# Patient Record
Sex: Male | Born: 1960 | Race: Black or African American | Hispanic: No | Marital: Single | Smoking: Former smoker
Health system: Southern US, Community
[De-identification: ages and names within clinical notes are randomized; demographics above are authoritative.]

## PROBLEM LIST (undated history)

## (undated) DIAGNOSIS — N529 Male erectile dysfunction, unspecified: Secondary | ICD-10-CM

## (undated) DIAGNOSIS — T7840XA Allergy, unspecified, initial encounter: Secondary | ICD-10-CM

## (undated) HISTORY — PX: BACK SURGERY: SHX140

## (undated) HISTORY — DX: Male erectile dysfunction, unspecified: N52.9

## (undated) HISTORY — PX: ELBOW ARTHROSCOPY: SUR87

---

## 2009-08-18 HISTORY — PX: MICRODISCECTOMY LUMBAR: SUR864

## 2009-09-14 ENCOUNTER — Ambulatory Visit: Payer: Self-pay | Admitting: Anesthesiology

## 2009-12-01 ENCOUNTER — Ambulatory Visit: Payer: Self-pay | Admitting: Anesthesiology

## 2009-12-24 ENCOUNTER — Ambulatory Visit: Payer: Self-pay | Admitting: Anesthesiology

## 2010-02-25 ENCOUNTER — Ambulatory Visit: Payer: Self-pay | Admitting: Anesthesiology

## 2013-08-04 ENCOUNTER — Ambulatory Visit: Payer: Self-pay | Admitting: Physician Assistant

## 2013-12-09 ENCOUNTER — Ambulatory Visit: Payer: Self-pay | Admitting: Physician Assistant

## 2014-01-03 ENCOUNTER — Emergency Department: Payer: Self-pay | Admitting: Emergency Medicine
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
## 2015-07-04 ENCOUNTER — Ambulatory Visit
Admission: EM | Admit: 2015-07-04 | Discharge: 2015-07-04 | Disposition: A | Discharge status: Home / Self Care | Payer: Worker's Compensation | Attending: Family Medicine | Admitting: Family Medicine

## 2015-07-04 DIAGNOSIS — S61512A Laceration without foreign body of left wrist, initial encounter: Secondary | ICD-10-CM | POA: Diagnosis not present

## 2015-07-04 MED ORDER — TETANUS-DIPHTH-ACELL PERTUSSIS 5-2.5-18.5 LF-MCG/0.5 IM SUSP
0.5000 mL | Freq: Once | INTRAMUSCULAR | Status: AC
  Administered 2015-07-04: 0.5 mL via INTRAMUSCULAR

## 2015-07-04 MED ORDER — AMOXICILLIN-POT CLAVULANATE 875-125 MG PO TABS: 1.0000 | ORAL_TABLET | Freq: Two times a day (BID) | ORAL | Status: DC

## 2015-07-04 NOTE — ED Notes (Signed)
States at work and got a 1 cm laceration on top left wrist. Bleeding controlled.

## 2015-07-13 ENCOUNTER — Ambulatory Visit
Admission: EM | Admit: 2015-07-13 | Discharge: 2015-07-13 | Disposition: A | Discharge status: Home / Self Care | Payer: Worker's Compensation

## 2015-07-13 NOTE — ED Notes (Signed)
3 sutures placed 9 days ago left outer wrist. For suture removal. Sutures removed easily and steri strips applied.

## 2015-08-17 ENCOUNTER — Ambulatory Visit
Admission: EM | Admit: 2015-08-17 | Discharge: 2015-08-17 | Disposition: A | Discharge status: Home / Self Care | Payer: Managed Care, Other (non HMO) | Attending: Family Medicine | Admitting: Family Medicine

## 2015-08-17 ENCOUNTER — Encounter: Payer: Self-pay | Admitting: Emergency Medicine

## 2015-08-17 DIAGNOSIS — J111 Influenza due to unidentified influenza virus with other respiratory manifestations: Secondary | ICD-10-CM

## 2015-08-17 LAB — RAPID INFLUENZA A&B ANTIGENS
Influenza A (ARMC): DETECTED — AB
Influenza B (ARMC): NOT DETECTED — AB

## 2015-08-17 MED ORDER — FEXOFENADINE-PSEUDOEPHED ER 180-240 MG PO TB24: 1.0000 | ORAL_TABLET | Freq: Every day | ORAL | Status: DC

## 2015-08-17 MED ORDER — HYDROCOD POLST-CPM POLST ER 10-8 MG/5ML PO SUER: 5.0000 mL | Freq: Two times a day (BID) | ORAL | Status: DC | PRN

## 2015-08-17 MED ORDER — MELOXICAM 15 MG PO TABS: 15.0000 mg | ORAL_TABLET | Freq: Every day | ORAL | Status: DC

## 2015-08-17 MED ORDER — OSELTAMIVIR PHOSPHATE 75 MG PO CAPS: 75.0000 mg | ORAL_CAPSULE | Freq: Two times a day (BID) | ORAL | Status: DC

## 2015-08-17 NOTE — ED Provider Notes (Signed)
CSN: [id number]     Arrival date & time 08/17/15  1330 History   First MD Initiated Contact with Patient 08/17/15 1604    Nurses notes were reviewed. Chief Complaint  Patient presents with  . Cough  . Nasal Congestion  . Generalized Body Aches   Patient reports doing fine until Saturday recently became sick. Several coworkers with the flu. He did get flu shot in 2016. He stopped smoking a few years ago. Mother had cancer and a grandmother with diabetes. No known drug allergies. States she's also been running a fever as well as the other aches and pains sore throat runny nose and nasal congestion and myalgia    (Consider location/radiation/quality/duration/timing/severity/associated sxs/prior Treatment) Patient is a 55 y.o. male presenting with cough. The history is provided by the patient.  Cough Cough characteristics:  Productive and hacking Sputum characteristics:  Wallace Cullens Severity:  Moderate Progression:  Worsening Chronicity:  New Context: upper respiratory infection   Relieved by:  Nothing Ineffective treatments:  Cough suppressants Associated symptoms: chills, fever, myalgias, rhinorrhea, sinus congestion and sore throat     History reviewed. No pertinent past medical history. Past Surgical History  Procedure Laterality Date  . Back surgery     Family History  Problem Relation Age of Onset  . Cancer Mother    Social History  Substance Use Topics  . Smoking status: Former Games developer  . Smokeless tobacco: None  . Alcohol Use: No    Review of Systems  Constitutional: Positive for fever and chills.  HENT: Positive for rhinorrhea and sore throat.   Respiratory: Positive for cough.   Musculoskeletal: Positive for myalgias.  All other systems reviewed and are negative.   Allergies  Review of patient's allergies indicates no known allergies.  Home Medications   Prior to Admission medications   Medication Sig Start Date End Date Taking? Authorizing Provider   amoxicillin-clavulanate (AUGMENTIN) 875-125 MG tablet Take 1 tablet by mouth 2 (two) times daily. 07/04/15   Payton Mccallum, MD  cetirizine (ZYRTEC) 10 MG tablet Take 10 mg by mouth daily.    Historical Provider, MD  chlorpheniramine-HYDROcodone (TUSSIONEX PENNKINETIC ER) 10-8 MG/5ML SUER Take 5 mLs by mouth every 12 (twelve) hours as needed for cough. 08/17/15   Hassan Rowan, MD  fexofenadine-pseudoephedrine (ALLEGRA-D ALLERGY & CONGESTION) 180-240 MG 24 hr tablet Take 1 tablet by mouth daily. 08/17/15   Hassan Rowan, MD  meloxicam (MOBIC) 15 MG tablet Take 1 tablet (15 mg total) by mouth daily. 08/17/15   Hassan Rowan, MD  oseltamivir (TAMIFLU) 75 MG capsule Take 1 capsule (75 mg total) by mouth 2 (two) times daily. 08/17/15   Hassan Rowan, MD  Triamcinolone Acetonide (NASACORT ALLERGY 24HR NA) Place into the nose.    Historical Provider, MD   Meds Ordered and Administered this Visit  Medications - No data to display  BP 123/75 mmHg  Pulse 84  Temp(Src) 97.5 F (36.4 C) (Tympanic)  Resp 16  Ht  (1.854 m)  Wt 210 lb (95.255 kg)  BMI 27.71 kg/m2  SpO2 100% No data found.   Physical Exam  Constitutional: He is oriented to person, place, and time. He appears well-developed and well-nourished.  HENT:  Head: Normocephalic and atraumatic.  Right Ear: Hearing, tympanic membrane, external ear and ear canal normal.  Left Ear: Hearing, tympanic membrane, external ear and ear canal normal.  Nose: Mucosal edema present.  Mouth/Throat: Uvula is midline. Posterior oropharyngeal erythema present.  Eyes: Conjunctivae are normal. Pupils are equal, round,  and reactive to light.  Neck: Normal range of motion. Neck supple.  Cardiovascular: Normal rate, regular rhythm and normal heart sounds.   Pulmonary/Chest: Effort normal and breath sounds normal.  Musculoskeletal: Normal range of motion. He exhibits no tenderness.  Lymphadenopathy:    He has cervical adenopathy.  Neurological: He is alert and  oriented to person, place, and time.  Skin: Skin is warm and dry.  Psychiatric: He has a normal mood and affect.  Vitals reviewed.   ED Course  Procedures (including critical care time)  Labs Review Labs Reviewed  RAPID INFLUENZA A&B ANTIGENS ([facility] ONLY) - Abnormal; Notable for the following:    Influenza A ([facility]) DETECTED (*)    Influenza B ([facility]) NOT DETECTED (*)    All other components within normal limits    Imaging Review No results found.   Visual Acuity Review  Right Eye Distance:   Left Eye Distance:   Bilateral Distance:    Right Eye Near:   Left Eye Near:    Bilateral Near:      Results for orders placed or performed during the hospital encounter of 08/17/15  Rapid Influenza A&B Antigens ([facility] only)  Result Value Ref Range   Influenza A ([facility]) DETECTED (A) NEGATIVE   Influenza B ([facility]) NOT DETECTED (A) NEGATIVE     MDM   1. Flu    We'll treat with Tamiflu twice a day 5 days, Tussionex 1 teaspoon twice a day, Allegra-D 1 tablet daily instead of Zyrtec. Mobic 15 mg of aches and pain 1 tablet daily. Work note given for Monday to see him Wednesday he may return to work on Thursday and is not better Thursday please see his PCP.  Note: This dictation was prepared with Dragon dictation along with smaller phrase technology. Any transcriptional errors that result from this process are unintentional.    Hassan Rowan, MD 08/17/15 [phone]

## 2015-08-17 NOTE — ED Notes (Signed)
Patient c/o cough, stuffy nose, HAs, and bodyaches since Saturday.  Patient report fever.

## 2015-08-17 NOTE — Discharge Instructions (Signed)
Influenza, Adult °Influenza (flu) is an infection in the mouth, nose, and throat (respiratory tract) caused by a virus. The flu can make you feel very ill. Influenza spreads easily from person to person (contagious).  °HOME CARE  °· Only take medicines as told by your doctor. °· Use a cool mist humidifier to make breathing easier. °· Get plenty of rest until your fever goes away. This usually takes 3 to 4 days. °· Drink enough fluids to keep your pee (urine) clear or pale yellow. °· Cover your mouth and nose when you cough or sneeze. °· Wash your hands well to avoid spreading the flu. °· Stay home from work or school until your fever has been gone for at least 1 full day. °· Get a flu shot every year. °GET HELP RIGHT AWAY IF:  °· You have trouble breathing or feel short of breath. °· Your skin or nails turn blue. °· You have severe neck pain or stiffness. °· You have a severe headache, facial pain, or earache. °· Your fever gets worse or keeps coming back. °· You feel sick to your stomach (nauseous), throw up (vomit), or have watery poop (diarrhea). °· You have chest pain. °· You have a deep cough that gets worse, or you cough up more thick spit (mucus). °MAKE SURE YOU:  °· Understand these instructions. °· Will watch your condition. °· Will get help right away if you are not doing well or get worse. °  °This information is not intended to replace advice given to you by your health care provider. Make sure you discuss any questions you have with your health care provider. °  °Document Released: 03/15/2008 Document Revised: 06/27/2014 Document Reviewed: 09/05/2011 °Elsevier Interactive Patient Education ©2016 [employer] ° °Upper Respiratory Infection, Adult °Most upper respiratory infections (URIs) are caused by a virus. A URI affects the nose, throat, and upper air passages. The most common type of URI is often called "the common cold." °HOME CARE  °· Take medicines only as told by your doctor. °· Gargle warm  saltwater or take cough drops to comfort your throat as told by your doctor. °· Use a warm mist humidifier or inhale steam from a shower to increase air moisture. This may make it easier to breathe. °· Drink enough fluid to keep your pee (urine) clear or pale yellow. °· Eat soups and other clear broths. °· Have a healthy diet. °· Rest as needed. °· Go back to work when your fever is gone or your doctor says it is okay. °¨ You may need to stay home longer to avoid giving your URI to others. °¨ You can also wear a face mask and wash your hands often to prevent spread of the virus. °· Use your inhaler more if you have asthma. °· Do not use any tobacco products, including cigarettes, chewing tobacco, or electronic cigarettes. If you need help quitting, ask your doctor. °GET HELP IF: °· You are getting worse, not better. °· Your symptoms are not helped by medicine. °· You have chills. °· You are getting more short of breath. °· You have brown or red mucus. °· You have yellow or brown discharge from your nose. °· You have pain in your face, especially when you bend forward. °· You have a fever. °· You have puffy (swollen) neck glands. °· You have pain while swallowing. °· You have white areas in the back of your throat. °GET HELP RIGHT AWAY IF:  °· You have very bad   or constant: °¨ Headache. °¨ Ear pain. °¨ Pain in your forehead, behind your eyes, and over your cheekbones (sinus pain). °¨ Chest pain. °· You have long-lasting (chronic) lung disease and any of the following: °¨ Wheezing. °¨ Long-lasting cough. °¨ Coughing up blood. °¨ A change in your usual mucus. °· You have a stiff neck. °· You have changes in your: °¨ Vision. °¨ Hearing. °¨ Thinking. °¨ Mood. °MAKE SURE YOU:  °· Understand these instructions. °· Will watch your condition. °· Will get help right away if you are not doing well or get worse. °  °This information is not intended to replace advice given to you by your health care provider. Make sure you  discuss any questions you have with your health care provider. °  °Document Released: 11/23/2007 Document Revised: 10/21/2014 Document Reviewed: 09/11/2013 °Elsevier Interactive Patient Education ©2016 Elsevier Inc. ° °

## 2015-08-28 NOTE — ED Provider Notes (Signed)
CSN: [id number]     Arrival date & time 07/04/15  1451 History   First MD Initiated Contact with Patient 07/04/15 1711     Chief Complaint  Patient presents with  . Laceration   (Consider location/radiation/quality/duration/timing/severity/associated sxs/prior Treatment) HPI Comments: 55 yo male with small laceration to top of left wrist about 1cm. Bleeding controlled. Not sure about last tetanus.   Patient is a 55 y.o. male presenting with skin laceration. The history is provided by the patient.  Laceration   History reviewed. No pertinent past medical history. Past Surgical History  Procedure Laterality Date  . Back surgery     Family History  Problem Relation Age of Onset  . Cancer Mother    Social History  Substance Use Topics  . Smoking status: Former Games developer  . Smokeless tobacco: None  . Alcohol Use: No    Review of Systems  Allergies  Review of patient's allergies indicates no known allergies.  Home Medications   Prior to Admission medications   Medication Sig Start Date End Date Taking? Authorizing Provider  cetirizine (ZYRTEC) 10 MG tablet Take 10 mg by mouth daily.   Yes Historical Provider, MD  Triamcinolone Acetonide (NASACORT ALLERGY 24HR NA) Place into the nose.   Yes Historical Provider, MD  amoxicillin-clavulanate (AUGMENTIN) 875-125 MG tablet Take 1 tablet by mouth 2 (two) times daily. 07/04/15   Payton Mccallum, MD  chlorpheniramine-HYDROcodone (TUSSIONEX PENNKINETIC ER) 10-8 MG/5ML SUER Take 5 mLs by mouth every 12 (twelve) hours as needed for cough. 08/17/15   Hassan Rowan, MD  fexofenadine-pseudoephedrine (ALLEGRA-D ALLERGY & CONGESTION) 180-240 MG 24 hr tablet Take 1 tablet by mouth daily. 08/17/15   Hassan Rowan, MD  meloxicam (MOBIC) 15 MG tablet Take 1 tablet (15 mg total) by mouth daily. 08/17/15   Hassan Rowan, MD  oseltamivir (TAMIFLU) 75 MG capsule Take 1 capsule (75 mg total) by mouth 2 (two) times daily. 08/17/15   Hassan Rowan, MD   Meds Ordered  and Administered this Visit   Medications  Tdap (BOOSTRIX) injection 0.5 mL (0.5 mLs Intramuscular Given 07/04/15 1700)    BP 108/66 mmHg  Pulse 80  Temp(Src) 97.1 F (36.2 C) (Tympanic)  Resp 18  Ht  (1.854 m)  Wt 210 lb (95.255 kg)  BMI 27.71 kg/m2  SpO2 98% No data found.   Physical Exam  Constitutional: He appears well-developed and well-nourished. No distress.  Musculoskeletal:       Left wrist: He exhibits laceration (1cm laceration to dorsum of left wrist). He exhibits normal range of motion, no tenderness, no bony tenderness, no swelling, no effusion, no crepitus and no deformity.       Arms: Hand neurovascularly intact  Skin: He is not diaphoretic.  Nursing note and vitals reviewed.   ED Course  Procedures (including critical care time)  Labs Review Labs Reviewed - No data to display  Imaging Review No results found.   Visual Acuity Review  Right Eye Distance:   Left Eye Distance:   Bilateral Distance:    Right Eye Near:   Left Eye Near:    Bilateral Near:         MDM   1. Laceration of wrist, left, initial encounter      Discharge Medication List as of 07/04/2015  5:56 PM    START taking these medications   Details  amoxicillin-clavulanate (AUGMENTIN) 875-125 MG tablet Take 1 tablet by mouth 2 (two) times daily., Starting 07/04/2015, Until Discontinued, Normal  1. diagnosis reviewed with patient; wound area cleaned, prepped in sterile fashion and anesthetized with 1% lidocaine and 3 interrupted sutures placed with 5.0 nylon; patient tolerated well 2. rx as per orders above; reviewed possible side effects, interactions, risks and benefits  3. Patient given Tdap vaccine in clinic; wound care instructions given 4. Follow-up in 1 week for suture removal or sooner prn if any problems; may return to work   Payton Mccallumrlando Leighanne Adolph, MD 08/28/15 1733

## 2016-07-22 ENCOUNTER — Encounter: Payer: Self-pay | Admitting: *Deleted

## 2016-07-25 ENCOUNTER — Encounter: Payer: Self-pay | Admitting: *Deleted

## 2016-07-25 ENCOUNTER — Ambulatory Visit: Payer: Commercial Managed Care - PPO | Admitting: Anesthesiology

## 2016-07-25 ENCOUNTER — Encounter
Admission: RE | Disposition: A | Discharge status: Home / Self Care | Payer: Self-pay | Source: Ambulatory Visit | Attending: Gastroenterology

## 2016-07-25 ENCOUNTER — Ambulatory Visit
Admission: RE | Admit: 2016-07-25 | Discharge: 2016-07-25 | Disposition: A | Discharge status: Home / Self Care | Payer: Commercial Managed Care - PPO | Source: Ambulatory Visit | Attending: Gastroenterology | Admitting: Gastroenterology

## 2016-07-25 DIAGNOSIS — Z1211 Encounter for screening for malignant neoplasm of colon: Secondary | ICD-10-CM | POA: Insufficient documentation

## 2016-07-25 DIAGNOSIS — K5730 Diverticulosis of large intestine without perforation or abscess without bleeding: Secondary | ICD-10-CM | POA: Diagnosis not present

## 2016-07-25 DIAGNOSIS — Z79899 Other long term (current) drug therapy: Secondary | ICD-10-CM | POA: Insufficient documentation

## 2016-07-25 HISTORY — DX: Allergy, unspecified, initial encounter: T78.40XA

## 2016-07-25 HISTORY — PX: COLONOSCOPY WITH PROPOFOL: SHX5780

## 2016-07-25 SURGERY — COLONOSCOPY WITH PROPOFOL
Anesthesia: General

## 2016-07-25 MED ORDER — PROPOFOL 10 MG/ML IV BOLUS
INTRAVENOUS | Status: DC | PRN
  Administered 2016-07-25: 20 mg via INTRAVENOUS
  Administered 2016-07-25: 50 mg via INTRAVENOUS

## 2016-07-25 MED ORDER — FENTANYL CITRATE (PF) 100 MCG/2ML IJ SOLN
INTRAMUSCULAR | Status: AC
  Filled 2016-07-25: qty 2

## 2016-07-25 MED ORDER — SODIUM CHLORIDE 0.9 % IV SOLN
INTRAVENOUS | Status: DC
  Administered 2016-07-25: 08:00:00 via INTRAVENOUS

## 2016-07-25 MED ORDER — PROPOFOL 500 MG/50ML IV EMUL
INTRAVENOUS | Status: DC | PRN
  Administered 2016-07-25: 160 ug/kg/min via INTRAVENOUS

## 2016-07-25 MED ORDER — MIDAZOLAM HCL 2 MG/2ML IJ SOLN
INTRAMUSCULAR | Status: DC | PRN
  Administered 2016-07-25: 2 mg via INTRAVENOUS

## 2016-07-25 MED ORDER — MIDAZOLAM HCL 2 MG/2ML IJ SOLN
INTRAMUSCULAR | Status: AC
  Filled 2016-07-25: qty 2

## 2016-07-25 MED ORDER — PROPOFOL 500 MG/50ML IV EMUL
INTRAVENOUS | Status: AC
  Filled 2016-07-25: qty 50

## 2016-07-25 MED ORDER — FENTANYL CITRATE (PF) 100 MCG/2ML IJ SOLN
INTRAMUSCULAR | Status: DC | PRN
  Administered 2016-07-25: 50 ug via INTRAVENOUS

## 2016-07-25 NOTE — Anesthesia Procedure Notes (Signed)
Date/Time: 07/25/2016 8:29 AM Performed by: Henrietta HooverPOPE, Elzy Tomasello Pre-anesthesia Checklist: Patient identified, Emergency Drugs available, Suction available, Patient being monitored and Timeout performed Patient Re-evaluated:Patient Re-evaluated prior to inductionOxygen Delivery Method: Nasal cannula Intubation Type: IV induction Placement Confirmation: positive ETCO2

## 2016-07-25 NOTE — Op Note (Signed)
[facility] Gastroenterology Patient Name: Austin RegalCharlton Matthews Procedure Date: 07/25/2016 8:06 AM MRN: [id number] Account #: [id number] Date of Birth: 1960-06-25 Admit Type: Outpatient Age: [age] [facility] Gender: Male Note Status: Finalized Procedure:            Colonoscopy Indications:          Screening for colorectal malignant neoplasm, This is                        the patient's first colonoscopy Providers:            Christena DeemMartin U. Skulskie, MD Referring MD:         Danella PentonMark F. Miller, MD (Referring MD) Medicines:            Monitored Anesthesia Care Complications:        No immediate complications. Procedure:            Pre-Anesthesia Assessment:                       - ASA Grade Assessment: I - A normal, healthy patient.                       After obtaining informed consent, the colonoscope was                        passed under direct vision. Throughout the procedure,                        the patient's blood pressure, pulse, and oxygen                        saturations were monitored continuously. The                        Colonoscope was introduced through the anus and                        advanced to the the cecum, identified by appendiceal                        orifice and ileocecal valve. The quality of the bowel                        preparation was good. The colonoscopy was performed                        without difficulty. The patient tolerated the procedure                        well. Findings:      Multiple small-mouthed diverticula were found in the sigmoid colon and       descending colon.      The retroflexed view of the distal rectum and anal verge was normal and       showed no anal or rectal abnormalities.      The digital rectal exam was normal. Impression:           - Diverticulosis in the sigmoid colon and in the  descending colon.                       - The distal rectum and anal verge are normal on                         retroflexion view.                       - No specimens collected. Recommendation:       - Discharge patient to home.                       - Repeat colonoscopy in 10 years for screening purposes. Procedure Code(s):    --- Professional ---                       [phone], Colonoscopy, flexible; diagnostic, including                        collection of specimen(s) by brushing or washing, when                        performed (separate procedure) Diagnosis Code(s):    --- Professional ---                       Z12.11, Encounter for screening for malignant neoplasm                        of colon                       K57.30, Diverticulosis of large intestine without                        perforation or abscess without bleeding CPT copyright 2016 American Medical Association. All rights reserved. The codes documented in this report are preliminary and upon coder review may  be revised to meet current compliance requirements. Christena Deem, MD 07/25/2016 8:39:10 AM This report has been signed electronically. Number of Addenda: 0 Note Initiated On: 07/25/2016 8:06 AM Scope Withdrawal Time: 0 hours 5 minutes 0 seconds  Total Procedure Duration: 0 hours 14 minutes 6 seconds       [facility]

## 2016-07-25 NOTE — Anesthesia Post-op Follow-up Note (Cosign Needed)
Anesthesia QCDR form completed.        

## 2016-07-25 NOTE — Transfer of Care (Signed)
Immediate Anesthesia Transfer of Care Note  Patient: Austin Matthews  Procedure(s) Performed: Procedure(s): COLONOSCOPY WITH PROPOFOL (N/A)  Patient Location: PACU  Anesthesia Type:General  Level of Consciousness: awake  Airway & Oxygen Therapy: Patient Spontanous Breathing and Patient connected to nasal cannula oxygen  Post-op Assessment: Report given to RN and Post -op Vital signs reviewed and stable  Post vital signs: Reviewed and stable  Last Vitals:  Vitals:   07/25/16 0744  BP: 130/61  Pulse: 79  Resp: 18  Temp: (!) 35.7 C    Last Pain:  Vitals:   07/25/16 0744  TempSrc: Tympanic         Complications: No apparent anesthesia complications

## 2016-07-25 NOTE — H&P (Signed)
Outpatient short stay form Pre-procedure 07/25/2016 8:01 AM Christena DeemMartin U Marquelle Balow MD  Primary Physician: Dr. Bethann PunchesMark Miller  Reason for visit:  Colonoscopy  History of present illness:  Patient is a 56 year old male presenting today as above. hAs not had a colonoscopy in the past. He tolerated his prep well. He takes no aspirin or blood thinning agents.    Current Facility-Administered Medications:  .  0.9 %  sodium chloride infusion, , Intravenous, Continuous, Christena DeemMartin U Krishana Lutze, MD .  0.9 %  sodium chloride infusion, , Intravenous, Continuous, Christena DeemMartin U Caylea Foronda, MD  Prescriptions Prior to Admission  Medication Sig Dispense Refill Last Dose  . cetirizine (ZYRTEC) 10 MG tablet Take 10 mg by mouth daily.   Past Week at Unknown time  . Triamcinolone Acetonide (NASACORT ALLERGY 24HR NA) Place into the nose.   Past Week at Unknown time  . amoxicillin-clavulanate (AUGMENTIN) 875-125 MG tablet Take 1 tablet by mouth 2 (two) times daily. (Patient not taking: Reported on 07/25/2016) 10 tablet 0 Completed Course at Unknown time  . chlorpheniramine-HYDROcodone (TUSSIONEX PENNKINETIC ER) 10-8 MG/5ML SUER Take 5 mLs by mouth every 12 (twelve) hours as needed for cough. (Patient not taking: Reported on 07/25/2016) 115 mL 0 Not Taking at Unknown time  . fexofenadine (ALLEGRA) 180 MG tablet Take 180 mg by mouth daily.   Not Taking at Unknown time  . fexofenadine-pseudoephedrine (ALLEGRA-D ALLERGY & CONGESTION) 180-240 MG 24 hr tablet Take 1 tablet by mouth daily. (Patient not taking: Reported on 07/25/2016) 30 tablet 0 Not Taking at Unknown time  . meloxicam (MOBIC) 15 MG tablet Take 1 tablet (15 mg total) by mouth daily. (Patient not taking: Reported on 07/25/2016) 30 tablet 1 Not Taking at Unknown time  . oseltamivir (TAMIFLU) 75 MG capsule Take 1 capsule (75 mg total) by mouth 2 (two) times daily. (Patient not taking: Reported on 07/25/2016) 10 capsule 0 Completed Course at Unknown time     No Known Allergies   Past  Medical History:  Diagnosis Date  . Allergic state     Review of systems:      Physical Exam    Heart and lungs: Regular rate and rhythm without rub or gallop lungs are bilaterally clear    HEENT: Normocephalic atraumatic eyes are anicteric    Other:     Pertinant exam for procedure: Soft nontender nondistended bowel sounds positive normoactive.    Planned proceedures: Colonoscopy and indicated procedures. I have discussed the risks benefits and complications of procedures to include not limited to bleeding, infection, perforation and the risk of sedation and the patient wishes to proceed.    Christena DeemMartin U Indi Willhite, MD Gastroenterology 07/25/2016  8:01 AM

## 2016-07-25 NOTE — Anesthesia Preprocedure Evaluation (Signed)
Anesthesia Evaluation  Patient identified by MRN, date of birth, ID band Patient awake    Reviewed: Allergy & Precautions, H&P , NPO status , Patient's Chart, lab work & pertinent test results, reviewed documented beta blocker date and time   History of Anesthesia Complications Negative for: history of anesthetic complications  Airway Mallampati: III  TM Distance: >3 FB Neck ROM: full    Dental  (+) Chipped, Poor Dentition, Dental Advidsory Given   Pulmonary neg shortness of breath, neg sleep apnea, neg COPD, Recent URI , Resolved, former smoker,           Cardiovascular Exercise Tolerance: Good negative cardio ROS       Neuro/Psych negative neurological ROS  negative psych ROS   GI/Hepatic negative GI ROS, Neg liver ROS,   Endo/Other  negative endocrine ROS  Renal/GU negative Renal ROS  negative genitourinary   Musculoskeletal   Abdominal   Peds  Hematology negative hematology ROS (+)   Anesthesia Other Findings Past Medical History: No date: Allergic state   Reproductive/Obstetrics negative OB ROS                             Anesthesia Physical Anesthesia Plan  ASA: I  Anesthesia Plan: General   Post-op Pain Management:    Induction:   Airway Management Planned:   Additional Equipment:   Intra-op Plan:   Post-operative Plan:   Informed Consent: I have reviewed the patients History and Physical, chart, labs and discussed the procedure including the risks, benefits and alternatives for the proposed anesthesia with the patient or authorized representative who has indicated his/her understanding and acceptance.   Dental Advisory Given  Plan Discussed with: Anesthesiologist, CRNA and Surgeon  Anesthesia Plan Comments:         Anesthesia Quick Evaluation

## 2016-07-25 NOTE — Anesthesia Postprocedure Evaluation (Signed)
Anesthesia Post Note  Patient: Austin Matthews  Procedure(s) Performed: Procedure(s) (LRB): COLONOSCOPY WITH PROPOFOL (N/A)  Patient location during evaluation: Endoscopy Anesthesia Type: General Level of consciousness: awake and alert Pain management: pain level controlled Vital Signs Assessment: post-procedure vital signs reviewed and stable Respiratory status: spontaneous breathing, nonlabored ventilation, respiratory function stable and patient connected to nasal cannula oxygen Cardiovascular status: blood pressure returned to baseline and stable Postop Assessment: no signs of nausea or vomiting Anesthetic complications: no     Last Vitals:  Vitals:   07/25/16 0905 07/25/16 0915  BP: 111/74 118/78  Pulse: 71 67  Resp: 17 12  Temp:      Last Pain:  Vitals:   07/25/16 0845  TempSrc: Tympanic                 Lenard SimmerAndrew Gavan Nordby

## 2016-07-26 ENCOUNTER — Encounter: Payer: Self-pay | Admitting: Gastroenterology

## 2018-03-01 DIAGNOSIS — Z833 Family history of diabetes mellitus: Secondary | ICD-10-CM | POA: Insufficient documentation

## 2018-03-01 DIAGNOSIS — E538 Deficiency of other specified B group vitamins: Secondary | ICD-10-CM | POA: Insufficient documentation

## 2018-06-07 ENCOUNTER — Encounter: Payer: Self-pay | Admitting: Emergency Medicine

## 2018-06-07 ENCOUNTER — Other Ambulatory Visit: Payer: Self-pay

## 2018-06-07 ENCOUNTER — Ambulatory Visit (INDEPENDENT_AMBULATORY_CARE_PROVIDER_SITE_OTHER): Payer: Worker's Compensation

## 2018-06-07 ENCOUNTER — Ambulatory Visit
Admission: EM | Admit: 2018-06-07 | Discharge: 2018-06-07 | Disposition: A | Discharge status: Home / Self Care | Payer: Worker's Compensation | Attending: Family Medicine | Admitting: Family Medicine

## 2018-06-07 DIAGNOSIS — X58XXXA Exposure to other specified factors, initial encounter: Secondary | ICD-10-CM

## 2018-06-07 DIAGNOSIS — S62664A Nondisplaced fracture of distal phalanx of right ring finger, initial encounter for closed fracture: Secondary | ICD-10-CM | POA: Insufficient documentation

## 2018-06-07 DIAGNOSIS — S61214A Laceration without foreign body of right ring finger without damage to nail, initial encounter: Secondary | ICD-10-CM | POA: Insufficient documentation

## 2018-06-07 DIAGNOSIS — S62634A Displaced fracture of distal phalanx of right ring finger, initial encounter for closed fracture: Secondary | ICD-10-CM | POA: Diagnosis not present

## 2018-06-07 DIAGNOSIS — S6010XA Contusion of unspecified finger with damage to nail, initial encounter: Secondary | ICD-10-CM | POA: Diagnosis not present

## 2018-06-07 MED ORDER — CEPHALEXIN 500 MG PO CAPS: 500.0000 mg | ORAL_CAPSULE | Freq: Four times a day (QID) | ORAL | 0 refills | Status: AC

## 2018-06-07 NOTE — ED Provider Notes (Signed)
[facility] ____________________________________________  Time seen: Approximately 9:10 AM  I have reviewed the triage vital signs and the nursing notes.   HISTORY  Chief Complaint Finger Injury   HPI Austin Matthews is a 57 y.o. male presenting for evaluation of Worker's Compensation injury to right fourth digit that occurred yesterday morning while at work.  States his finger tip got crushed between 2 pieces of metal.  States pain currently is better than it was yesterday, stating mild to moderate.  Worse with direct palpation.  Denies loss of sensation.  States has had some bleeding from the small cut.  Has been cleaning it at work as well as at home.  States yesterday he was able to still have full range of motion, but states as it has continued to swell he has some decreased range of motion at the end of his finger.  Right-hand-dominant.  Reports tetanus immunization is up-to-date, last given 2 years ago.  Has not taken any over-the-counter medication today for the same complaint.  Denies other aggravating alleviating factors.  Reports otherwise doing well denies other complaints.  Denies other injury.    Past Medical History:  Diagnosis Date  . Allergic state     There are no active problems to display for this patient.   Past Surgical History:  Procedure Laterality Date  . BACK SURGERY    . COLONOSCOPY WITH PROPOFOL N/A 07/25/2016   Procedure: COLONOSCOPY WITH PROPOFOL;  Surgeon: Christena Deem, MD;  Location: [facility] ENDOSCOPY;  Service: Endoscopy;  Laterality: N/A;  . ELBOW ARTHROSCOPY Left   . MICRODISCECTOMY LUMBAR  08/18/2009     No current facility-administered medications for this encounter.   Current Outpatient Medications:  .  Triamcinolone Acetonide (NASACORT ALLERGY 24HR NA), Place into the nose., Disp: , Rfl:  .  cephALEXin (KEFLEX) 500 MG capsule, Take 1 capsule (500 mg total) by mouth 4 (four) times daily for 7 days., Disp: 28 capsule, Rfl:  0  Allergies Patient has no known allergies.  Family History  Problem Relation Age of Onset  . Cancer Mother     Social History Social History   Tobacco Use  . Smoking status: Former Smoker    Types: Cigarettes    Last attempt to quit: 08/25/2009    Years since quitting: 8.7  . Smokeless tobacco: Never Used  Substance Use Topics  . Alcohol use: No  . Drug use: No    Review of Systems Constitutional: No fever Cardiovascular: Denies chest pain. Respiratory: Denies shortness of breath. Gastrointestinal: No abdominal pain. Musculoskeletal: Negative for back pain. Skin: as above  Neurological: Negative for headaches, focal weakness or numbness.    ____________________________________________   PHYSICAL EXAM:  VITAL SIGNS: ED Triage Vitals  Enc Vitals Group     BP 06/07/18 0901 (!) 141/86     Pulse Rate 06/07/18 0901 85     Resp 06/07/18 0901 18     Temp 06/07/18 0901 98.2 F (36.8 C)     Temp Source 06/07/18 0901 Oral     SpO2 06/07/18 0901 99 %     Weight 06/07/18 0902 205 lb (93 kg)     Height 06/07/18 0902 6\' 1"  (1.854 m)     Head Circumference --      Peak Flow --      Pain Score 06/07/18 0901 10     Pain Loc --      Pain Edu? --      Excl. in GC? --  Constitutional: Alert and oriented. Well appearing and in no acute distress. ENT      Head: Normocephalic and atraumatic. Cardiovascular: Normal rate, regular rhythm. Grossly normal heart sounds.  Good peripheral circulation. Respiratory: Normal respiratory effort without tachypnea nor retractions. Breath sounds are clear and equal bilaterally. No wheezes, rales, rhonchi. Musculoskeletal:   Bilateral distal radial pulses equal and easily palpated. Except:       Right hand fourth digit depicted as above, 1 cm superficial laceration present to the distal palmar aspect, with subungual hematoma noted, moderate tenderness to distal finger with mild to moderate edema and ecchymosis.  Decreased flexion at  DIP joint, full extension, good resisted flexion extension strength.  Right hand otherwise nontender.  Neurologic:  Normal speech and language. Speech is normal. No gait instability.  Skin:  Skin is warm, dry  Psychiatric: Mood and affect are normal. Speech and behavior are normal. Patient exhibits appropriate insight and judgment   ___________________________________________   LABS (all labs ordered are listed, but only abnormal results are displayed)  Labs Reviewed - No data to display ____________________________________________  RADIOLOGY  Dg Finger Ring Right  Result Date: 06/07/2018 CLINICAL DATA:  Fourth digit crush injury, initial encounter EXAM: RIGHT RING FINGER 2+V COMPARISON:  None. FINDINGS: There is an undisplaced fracture at the distal aspect of the fourth distal phalangeal tuft. Mild soft tissue swelling is noted. IMPRESSION: Undisplaced fourth distal phalangeal tuft fracture. Electronically Signed   By: Alcide CleverMark  Lukens M.D.   On: 06/07/2018 09:31   ____________________________________________   PROCEDURES Procedures  Procedure(s) performed:  Procedure(s) performed:  Procedure explained and verbal consent obtained. Consent: Verbal consent obtained. Written consent not obtained. Risks and benefits: risks, benefits and alternatives were discussed Patient identity confirmed: verbally with patient and hospital-assigned identification number  Consent given by: patient   Subungual hematoma drainage. Location: Right fourth digit Preparation: Patient was prepped and draped in the usual sterile fashion. Anesthesia none Irrigation solution: saline and betadine Amount of cleaning: copious Cautery pen utilized to make small bore and proximal nail, immediate release blood with improved pressure for patient.  Finger splint applied and buddy taped third and fourth fingers by RN. dressing applied.  Wound care instructions provided.  Observe for any signs of infection or other  problems.     INITIAL IMPRESSION / ASSESSMENT AND PLAN / ED COURSE  Pertinent labs & imaging results that were available during my care of the patient were reviewed by me and considered in my medical decision making (see chart for details).  Well-appearing patient.  No acute distress.  Right fourth digit pain post injury that occurred yesterday at work.  Worker's Compensation injury.  Laceration superficial, no repair indicated at this time.  Discussed keeping clean, topical over-the-counter antibiotics and supportive care.  Sub-ungual hematoma drained.  Encourage warm soapy water soaks.  Distal tuft fracture.  Finger splint applied.  As area has been open and obtained from dirty metal object, will empirically treat with Keflex.  Follow-up with occupational health this coming week.  Call to schedule.  Workers note given for no lifting greater than 5 pounds with right hand.  Encourage supportive care, over-the-counter Tylenol ibuprofen as needed.  Discussed sooner return parameters.Discussed indication, risks and benefits of medications with patient.  Discussed follow up with Primary care physician this week. Discussed follow up and return parameters including no resolution or any worsening concerns. Patient verbalized understanding and agreed to plan.   ____________________________________________   FINAL CLINICAL IMPRESSION(S) / ED DIAGNOSES  Final diagnoses:  Nondisplaced fracture of distal phalanx of right ring finger, initial encounter for closed fracture  Subungual hematoma of finger of right hand, initial encounter  Laceration of right ring finger without foreign body without damage to nail, initial encounter     ED Discharge Orders         Ordered    cephALEXin (KEFLEX) 500 MG capsule  4 times daily     06/07/18 0947           Note: This dictation was prepared with Dragon dictation along with smaller phrase technology. Any transcriptional errors that result from this process  are unintentional.         Renford DillsMiller, Kindell Strada, NP 06/07/18 1036

## 2018-06-07 NOTE — Discharge Instructions (Addendum)
Take medication as prescribed. Keep clean. Warm soapy water soaks. Wear finger splint. Monitor.   Follow up with occupational health this coming week as discussed. See above to call to scheduled.  Seen sooner for redness, drainage, increased pain.   Follow up with your primary care physician this week as needed. Return to Urgent care for new or worsening concerns.

## 2018-06-07 NOTE — ED Triage Notes (Signed)
Patient c/o right ring finger pain/injury that happened yesterday when he smashed his finger in between 2 metal pieces. He states this happened around 10:15am yesterday.

## 2018-08-02 DIAGNOSIS — S62609A Fracture of unspecified phalanx of unspecified finger, initial encounter for closed fracture: Secondary | ICD-10-CM | POA: Insufficient documentation

## 2019-04-10 ENCOUNTER — Encounter: Payer: Self-pay | Admitting: Urology

## 2019-04-10 ENCOUNTER — Other Ambulatory Visit: Payer: Self-pay

## 2019-04-10 ENCOUNTER — Ambulatory Visit (INDEPENDENT_AMBULATORY_CARE_PROVIDER_SITE_OTHER): Payer: Commercial Managed Care - PPO | Admitting: Urology

## 2019-04-10 VITALS — BP 137/81 | HR 84 | Ht 72.0 in | Wt 203.0 lb

## 2019-04-10 DIAGNOSIS — M25529 Pain in unspecified elbow: Secondary | ICD-10-CM | POA: Insufficient documentation

## 2019-04-10 DIAGNOSIS — S139XXA Sprain of joints and ligaments of unspecified parts of neck, initial encounter: Secondary | ICD-10-CM | POA: Insufficient documentation

## 2019-04-10 DIAGNOSIS — S83419A Sprain of medial collateral ligament of unspecified knee, initial encounter: Secondary | ICD-10-CM | POA: Insufficient documentation

## 2019-04-10 DIAGNOSIS — S82009A Unspecified fracture of unspecified patella, initial encounter for closed fracture: Secondary | ICD-10-CM | POA: Insufficient documentation

## 2019-04-10 DIAGNOSIS — S5000XA Contusion of unspecified elbow, initial encounter: Secondary | ICD-10-CM | POA: Insufficient documentation

## 2019-04-10 DIAGNOSIS — M7710 Lateral epicondylitis, unspecified elbow: Secondary | ICD-10-CM | POA: Insufficient documentation

## 2019-04-10 DIAGNOSIS — N5201 Erectile dysfunction due to arterial insufficiency: Secondary | ICD-10-CM | POA: Diagnosis not present

## 2019-04-10 DIAGNOSIS — M25569 Pain in unspecified knee: Secondary | ICD-10-CM | POA: Insufficient documentation

## 2019-04-10 DIAGNOSIS — S8290XA Unspecified fracture of unspecified lower leg, initial encounter for closed fracture: Secondary | ICD-10-CM | POA: Insufficient documentation

## 2019-04-10 DIAGNOSIS — S8000XA Contusion of unspecified knee, initial encounter: Secondary | ICD-10-CM | POA: Insufficient documentation

## 2019-04-10 MED ORDER — TADALAFIL 20 MG PO TABS: ORAL_TABLET | ORAL | 0 refills | Status: AC

## 2019-04-10 NOTE — Progress Notes (Signed)
04/10/2019 11:56 AM   Donnie Aho [birth year] [id number]  Referring provider: Rusty Aus, MD [facility] [place],  [place] [address]  Chief Complaint  Patient presents with  . Erectile Dysfunction    HPI: Austin Matthews is a 58 y.o. male seen in consultation at request of Dr. Sabra Heck for evaluation of ED.  He presents with a 1 year history of difficulty achieving and maintaining an erection.  He states he is able to get erections firm enough for penetration much less than 50% of the time.  The times he does achieve penetration he is able to maintain the erection approximately 50% of the time.  SHIM was 10/25 indicating moderate ED.  Denies pain or curvature with erections.  He has used sildenafil up to a 60 mg with mild improvement.  He also complains of premature ejaculation.  Testosterone level was 363.  He has no bothersome lower urinary tract symptoms.  PSA 03/01/2019 was 0.41  Significant organic risk factor is tobacco history.  He quit approximately 9 years ago.   PMH: Past Medical History:  Diagnosis Date  . Allergic state     Surgical History: Past Surgical History:  Procedure Laterality Date  . BACK SURGERY    . COLONOSCOPY WITH PROPOFOL N/A 07/25/2016   Procedure: COLONOSCOPY WITH PROPOFOL;  Surgeon: Lollie Sails, MD;  Location: [facility] ENDOSCOPY;  Service: Endoscopy;  Laterality: N/A;  . ELBOW ARTHROSCOPY Left   . MICRODISCECTOMY LUMBAR  08/18/2009    Home Medications:  Allergies as of 04/10/2019      Reactions   Other Itching   Pet Dander - itching, sneezing   Propoxyphene    Other reaction(s): Other (See Comments) lightheaded      Medication List       Accurate as of April 10, 2019 11:56 AM. If you have any questions, ask your nurse or doctor.        STOP taking these medications   NASACORT ALLERGY 24HR NA Stopped by: Abbie Sons, MD     TAKE these medications   cyclobenzaprine 10 MG  tablet Commonly known as: FLEXERIL TAKE 1 TABLET BY MOUTH TWICE A DAY AS NEEDED FOR MUSCLE SPASMS   HYDROcodone-acetaminophen 5-325 MG tablet Commonly known as: NORCO/VICODIN TAKE 1 TAB BY MOUTH EVERY 4 6 HRS AS NEEDED   ibuprofen 800 MG tablet Commonly known as: ADVIL Take 800 mg by mouth every 8 (eight) hours as needed. for pain       Allergies:  Allergies  Allergen Reactions  . Other Itching    Pet Dander - itching, sneezing  . Propoxyphene     Other reaction(s): Other (See Comments) lightheaded    Family History: Family History  Problem Relation Age of Onset  . Cancer Mother     Social History:  reports that he quit smoking about 9 years ago. His smoking use included cigarettes. He has never used smokeless tobacco. He reports that he does not drink alcohol or use drugs.  ROS: UROLOGY Frequent Urination?: No Hard to postpone urination?: No Burning/pain with urination?: No Get up at night to urinate?: No Leakage of urine?: No Urine stream starts and stops?: No Trouble starting stream?: No Do you have to strain to urinate?: No Blood in urine?: No Urinary tract infection?: No Sexually transmitted disease?: No Injury to kidneys or bladder?: No Painful intercourse?: No Weak stream?: No Erection problems?: Yes Penile pain?: No  Gastrointestinal Nausea?: No Vomiting?: No Indigestion/heartburn?:  No Diarrhea?: No Constipation?: No  Constitutional Fever: No Night sweats?: No Weight loss?: No Fatigue?: No  Skin Skin rash/lesions?: No Itching?: No  Eyes Blurred vision?: No Double vision?: No  Ears/Nose/Throat Sore throat?: No Sinus problems?: No  Hematologic/Lymphatic Swollen glands?: No Easy bruising?: No  Cardiovascular Leg swelling?: No Chest pain?: No  Respiratory Cough?: No Shortness of breath?: No  Endocrine Excessive thirst?: No  Musculoskeletal Back pain?: No Joint pain?: No  Neurological Headaches?: No Dizziness?: No   Psychologic Depression?: No Anxiety?: No  Physical Exam: BP 137/81 (BP Location: Left Arm, Patient Position: Sitting, Cuff Size: Normal)   Pulse 84   Ht 6' (1.829 m)   Wt 203 lb (92.1 kg)   BMI 27.53 kg/m   Constitutional:  Alert and oriented, No acute distress. HEENT: [place] AT, moist mucus membranes.  Trachea midline, no masses. Cardiovascular: No clubbing, cyanosis, or edema. Respiratory: Normal respiratory effort, no increased work of breathing. GI: Abdomen is soft, nontender, nondistended, no abdominal masses GU: Phallus circumcised without lesions.  Mild left testis atrophy.  Right testis palpably normal.  Spermatic cord/epididymis palpably normal bilaterally. Lymph: No cervical or inguinal lymphadenopathy. Skin: No rashes, bruises or suspicious lesions. Neurologic: Grossly intact, no focal deficits, moving all 4 extremities. Psychiatric: Normal mood and affect.   Assessment & Plan:    - Erectile dysfunction He has not had an adequate PDE 5 inhibitor trial.  He desired to trial a different medication and Rx generic tadalafil 20 mg was sent to pharmacy.  He was instructed to use this medication on at least 5 occasions to adequately determine efficacy.  We also discussed intracavernosal injections and vacuum erection devices. If PDE 5 inhibitor is not effective he will follow up in approximate 4 weeks to further discuss other options.   Riki Altes, MD  [facility] [address] [place], [place] [address] [phone]

## 2019-04-11 ENCOUNTER — Encounter: Payer: Self-pay | Admitting: Urology

## 2019-05-08 ENCOUNTER — Ambulatory Visit (INDEPENDENT_AMBULATORY_CARE_PROVIDER_SITE_OTHER): Payer: Commercial Managed Care - PPO | Admitting: Urology

## 2019-05-08 ENCOUNTER — Other Ambulatory Visit: Payer: Self-pay

## 2019-05-08 ENCOUNTER — Encounter: Payer: Self-pay | Admitting: Urology

## 2019-05-08 VITALS — BP 122/74 | HR 86 | Ht 72.0 in | Wt 203.0 lb

## 2019-05-08 DIAGNOSIS — N5201 Erectile dysfunction due to arterial insufficiency: Secondary | ICD-10-CM

## 2019-05-08 MED ORDER — NONFORMULARY OR COMPOUNDED ITEM: 0 refills | Status: AC

## 2019-05-08 NOTE — Progress Notes (Signed)
05/08/2019 11:58 AM   Donnie Aho 1960/08/28 [id number]  Referring provider: Rusty Aus, MD [facility] [place],  [place] [address]  Chief Complaint  Patient presents with  . Erectile Dysfunction    HPI: Mr. Cardinal presents for follow-up of ADD.  He was seen 04/10/2019.  He states tadalafil was not effective and he had a side effect of headache.   PMH: Past Medical History:  Diagnosis Date  . Allergic state   . ED (erectile dysfunction)     Surgical History: Past Surgical History:  Procedure Laterality Date  . BACK SURGERY    . COLONOSCOPY WITH PROPOFOL N/A 07/25/2016   Procedure: COLONOSCOPY WITH PROPOFOL;  Surgeon: Lollie Sails, MD;  Location: [facility] ENDOSCOPY;  Service: Endoscopy;  Laterality: N/A;  . ELBOW ARTHROSCOPY Left   . MICRODISCECTOMY LUMBAR  08/18/2009    Home Medications:  Allergies as of 05/08/2019      Reactions   Other Itching   Pet Dander - itching, sneezing   Propoxyphene    Other reaction(s): Other (See Comments) lightheaded      Medication List       Accurate as of May 08, 2019 11:58 AM. If you have any questions, ask your nurse or doctor.        cyclobenzaprine 10 MG tablet Commonly known as: FLEXERIL TAKE 1 TABLET BY MOUTH TWICE A DAY AS NEEDED FOR MUSCLE SPASMS   HYDROcodone-acetaminophen 5-325 MG tablet Commonly known as: NORCO/VICODIN TAKE 1 TAB BY MOUTH EVERY 4 6 HRS AS NEEDED   ibuprofen 800 MG tablet Commonly known as: ADVIL Take 800 mg by mouth every 8 (eight) hours as needed. for pain   tadalafil 20 MG tablet Commonly known as: CIALIS 1 tablet by mouth 30 minutes prior to intercourse       Allergies:  Allergies  Allergen Reactions  . Other Itching    Pet Dander - itching, sneezing  . Propoxyphene     Other reaction(s): Other (See Comments) lightheaded    Family History: Family History  Problem Relation Age of Onset  . Cancer Mother      Social History:  reports that he quit smoking about 9 years ago. His smoking use included cigarettes. He has never used smokeless tobacco. He reports that he does not drink alcohol or use drugs.  ROS: UROLOGY Frequent Urination?: No Hard to postpone urination?: No Burning/pain with urination?: No Get up at night to urinate?: No Leakage of urine?: No Urine stream starts and stops?: No Trouble starting stream?: No Do you have to strain to urinate?: No Blood in urine?: No Urinary tract infection?: No Sexually transmitted disease?: No Injury to kidneys or bladder?: No Painful intercourse?: No Weak stream?: No Erection problems?: Yes Penile pain?: No  Gastrointestinal Nausea?: No Vomiting?: No Indigestion/heartburn?: No Diarrhea?: No Constipation?: No  Constitutional Fever: No Night sweats?: No Weight loss?: No Fatigue?: No  Skin Skin rash/lesions?: No Itching?: No  Eyes Blurred vision?: No Double vision?: No  Ears/Nose/Throat Sore throat?: No Sinus problems?: No  Hematologic/Lymphatic Swollen glands?: No Easy bruising?: No  Cardiovascular Leg swelling?: No Chest pain?: No  Respiratory Cough?: No Shortness of breath?: No  Endocrine Excessive thirst?: No  Musculoskeletal Back pain?: No Joint pain?: No  Neurological Headaches?: No Dizziness?: No  Psychologic Depression?: No Anxiety?: No  Physical Exam: BP 122/74 (BP Location: Left Arm, Patient Position: Sitting, Cuff Size: Normal)   Pulse 86   Ht 6' (1.829 m)  Wt 203 lb (92.1 kg)   BMI 27.53 kg/m   Constitutional:  Alert and oriented, No acute distress. HEENT: [place] AT, moist mucus membranes.  Trachea midline, no masses. Cardiovascular: No clubbing, cyanosis, or edema. Respiratory: Normal respiratory effort, no increased work of breathing. Skin: No rashes, bruises or suspicious lesions. Neurologic: Grossly intact, no focal deficits, moving all 4 extremities. Psychiatric: Normal mood and  affect.   Assessment & Plan:    - Erectile dysfunction He has failed PDE 5 inhibitor therapy.  I discussed intracavernosal injections, vacuum erection devices and penile implant surgery.  He was interested in pursuing intracavernosal injections.  Potential side effects were discussed including bruising, corporal scarring and priapism.  He will return for an appointment for test injection and injection training with Carollee Herter.  Rx Trimix will be sent to [facility]  Greater than 50% of this 15-minute visit was spent counseling the patient.   Riki Altes, MD  [facility] [address] [place], [place] [address] [phone]

## 2019-05-09 NOTE — Progress Notes (Signed)
Mr. Line presents today for a Trimix titration.  He is no longer having spontaneous erections.   He has had no response to PDE5i's.  He denies any history of sickle cell anemia or trait, a history of multiple myeloma or a history of leukemia.  He has not taken trazodone or a PDE5i's today.   Patient's left corpus cavernosum is identified.  An area near the base of the penis is cleansed with rubbing alcohol.  Careful to avoid the dorsal vein, 2 mcg of Edex 10 mcg ([id number] exp 12/2019) is injected at a 90 degree angle into the left corpus cavernosum near the base of the penis.  Patient experienced a fullness in the shaft, but no erection in 15 minutes.    I then injected 2 mcg of Edex 10 mcg into the right corpus cavernosum near the base of the penis.  He still did not experience an erection.  At this point, we decided that he will return next week and we will inject with Edex 20 mcg.    Advised patient of the condition of priapism, painful erection lasting for more than four hours, and to contact the office immediately or seek treatment in the ED

## 2019-05-10 ENCOUNTER — Encounter: Payer: Self-pay | Admitting: Urology

## 2019-05-10 ENCOUNTER — Other Ambulatory Visit: Payer: Self-pay

## 2019-05-10 ENCOUNTER — Ambulatory Visit: Payer: Commercial Managed Care - PPO | Admitting: Urology

## 2019-05-10 VITALS — BP 134/87 | HR 76 | Ht 72.0 in | Wt 204.0 lb

## 2019-05-10 DIAGNOSIS — N529 Male erectile dysfunction, unspecified: Secondary | ICD-10-CM | POA: Diagnosis not present

## 2019-05-14 NOTE — Progress Notes (Deleted)
Austin Matthews presents today for a Edex titration.  He is no longer/still having spontaneous erections. ***   He has had no response to PDE5i's.  ***He has contraindications to PDE5i's. ***   He has had moderate response to PDE5i's. ***   He denies any history of sickle cell anemia or trait, a history of multiple myeloma or a history of leukemia.  ***    He has not taken trazodone or a PDE5i's today.  ***  Patient's left corpus cavernosum is identified.  An area near the base of the penis is cleansed with rubbing alcohol.  Careful to avoid the dorsal vein, 20 mcg of alprostadil is injected at a 90 degree angle into the left corpus cavernosum near the base of the penis.  Patient experienced a very firm erection in 15 minutes.  The curvature is measured at 40 degrees dorsally and the plaque is identified and marked with a marking pen.  The plaque appears to be 20 mm x 15 mm in size on the dorsal side of the penis, 1 cm from the penile base.  It is causing a slight hour glass deformity.  ***  Patient's left corpus cavernosum is identified.  An area near the base of the penis is cleansed with rubbing alcohol.  Careful to avoid the dorsal vein, 2 mcg of Trimix (papaverine 30 mg, phentolamine 1 mg and prostaglandin E1 10 mcg, Lot # [id number]  exp # 03/23/2017) is injected at a 90 degree angle into the left corpus cavernosum near the base of the penis.  Patient experienced a very firm erection in 15 minutes.    Advised patient of the condition of priapism, painful erection lasting for more than four hours, and to contact the office immediately or seek treatment in the ED

## 2019-05-15 ENCOUNTER — Ambulatory Visit: Payer: Commercial Managed Care - PPO | Admitting: Urology

## 2019-05-19 NOTE — Progress Notes (Signed)
Mr. Wadlow presents today for a Edex titration.  He is no longer having spontaneous erections.  He has had no response to PDE5i's.  He denies any history of sickle cell anemia or trait, a history of multiple myeloma or a history of leukemia.  He has not taken trazodone or a PDE5i's today.    Patient's left corpus cavernosum is identified.  An area near the base of the penis is cleansed with rubbing alcohol.  Careful to avoid the dorsal vein, 2.5 mcg of Edex 20 mcg Lot # [id number] # 01/2020) is injected at a 90 degree angle into the left corpus cavernosum near the base of the penis.  Patient experienced a fullness in the penis in 15 minutes.    I injected 2.5 mcg into the right corpus cavernosum.  He experienced a very firm erection, but he lost the erection when I entered the room.    He will return on Wednesday with Trimix so that I can instruct him on injecting the medication.   Advised patient of the condition of priapism, painful erection lasting for more than four hours, and to contact the office immediately or seek treatment in the ED

## 2019-05-20 ENCOUNTER — Encounter: Payer: Self-pay | Admitting: Urology

## 2019-05-20 ENCOUNTER — Other Ambulatory Visit: Payer: Self-pay

## 2019-05-20 ENCOUNTER — Ambulatory Visit: Payer: Commercial Managed Care - PPO | Admitting: Urology

## 2019-05-20 VITALS — BP 130/83 | HR 101 | Ht 72.0 in | Wt 203.0 lb

## 2019-05-20 DIAGNOSIS — N529 Male erectile dysfunction, unspecified: Secondary | ICD-10-CM | POA: Diagnosis not present

## 2019-05-21 NOTE — Progress Notes (Signed)
Patient presents today for instructions on Trimix injections and to demonstrate his proficiency with the injections.  Patient's left corpus cavernosum is identified.  An area near the base of the penis is cleansed with rubbing alcohol.  Careful to avoid the dorsal vein, 2 mcg of Trimix (papaverine 30 mg, phentolamine 1 mg and prostaglandin E1 10 mcg, Lot # [id number]  exp # 06/20/2019) is injected at a 90 degree angle into the left corpus cavernosum near the base of the penis.  Patient experienced a fullness in the penis in 15 minutes.    The patient washed his hands.  He then was instructed to tap the syringe to allow air bubbles to float to the top of the syringe. He then depressed the plunger to allow the air to escape.  He then selected an injection site at the right base of his penis between 9 and 11:00 positions between the base and midportion of the penis. He was careful to avoid the 6 and 12:00 positions and any surface veins or arteries.  He then grabbed the head of the penis towards the left with light tension.  He then cleansed the area using an alcohol swab. He then took the syringe and injected at a 90 angle into the corpus cavernosum 2 g of Trimix.(lot # [id number]  exp. 12/31/202)  He then applied compression to the injection site.  He tolerated the procedure well. He stated he had good comfort level with the process of the injections.    He is still not having satisfactory response to the Trimix, so he will continue to self titrate over the weekend.  He will inject 6 mcg with his next injection over the weekend.   Advised patient of the condition of priapism, painful erection lasting for more than four hours, and to contact the office immediately or seek treatment in the ED

## 2019-05-22 ENCOUNTER — Other Ambulatory Visit: Payer: Self-pay

## 2019-05-22 ENCOUNTER — Encounter: Payer: Self-pay | Admitting: Urology

## 2019-05-22 ENCOUNTER — Ambulatory Visit: Payer: Commercial Managed Care - PPO | Admitting: Urology

## 2019-05-22 VITALS — BP 124/80 | HR 85 | Ht 72.0 in | Wt 203.0 lb

## 2019-05-22 DIAGNOSIS — N529 Male erectile dysfunction, unspecified: Secondary | ICD-10-CM

## 2019-05-22 NOTE — Patient Instructions (Addendum)

## 2019-05-29 ENCOUNTER — Ambulatory Visit: Payer: Commercial Managed Care - PPO | Admitting: Urology

## 2019-05-30 ENCOUNTER — Ambulatory Visit: Payer: Commercial Managed Care - PPO | Admitting: Urology

## 2019-09-27 ENCOUNTER — Ambulatory Visit: Payer: Commercial Managed Care - PPO | Attending: Internal Medicine

## 2019-09-27 DIAGNOSIS — Z23 Encounter for immunization: Secondary | ICD-10-CM

## 2019-09-27 NOTE — Progress Notes (Signed)
   Covid-19 Vaccination Clinic  Name:  CLEVEN JANSMA    MRN: [id number] DOB: [birth year]  09/27/2019  Mr. Schopf was observed post Covid-19 immunization for 15 minutes without incident. He was provided with Vaccine Information Sheet and instruction to access the V-Safe system.   Mr. Shafer was instructed to call 911 with any severe reactions post vaccine: Marland Kitchen Difficulty breathing  . Swelling of face and throat  . A fast heartbeat  . A bad rash all over body  . Dizziness and weakness   Immunizations Administered    Name Date Dose VIS Date Route   Pfizer COVID-19 Vaccine 09/27/2019  9:50 AM 0.3 mL 05/31/2019 Intramuscular   Manufacturer: [employer], [employer]   Lot: [id number]   NDC: [address]-3

## 2019-10-23 ENCOUNTER — Ambulatory Visit: Payer: Commercial Managed Care - PPO | Attending: Internal Medicine

## 2019-10-23 DIAGNOSIS — Z23 Encounter for immunization: Secondary | ICD-10-CM

## 2019-10-23 NOTE — Progress Notes (Signed)
   Covid-19 Vaccination Clinic  Name:  Austin Matthews    MRN: [id number] DOB: 01-08-1961  10/23/2019  Austin Matthews was observed post Covid-19 immunization for 15 minutes without incident. He was provided with Vaccine Information Sheet and instruction to access the V-Safe system.   Austin Matthews was instructed to call 911 with any severe reactions post vaccine: Marland Kitchen Difficulty breathing  . Swelling of face and throat  . A fast heartbeat  . A bad rash all over body  . Dizziness and weakness   Immunizations Administered    Name Date Dose VIS Date Route   Pfizer COVID-19 Vaccine 10/23/2019  9:50 AM 0.3 mL 08/14/2018 Intramuscular   Manufacturer: [employer], [employer]   Lot: [id number]   NDC: [address]-0
